# Patient Record
Sex: Female | Born: 1945 | Hispanic: No | Marital: Married | Smoking: Never smoker
Health system: Southern US, Community
[De-identification: ages and names within clinical notes are randomized; demographics above are authoritative.]

## PROBLEM LIST (undated history)

## (undated) DIAGNOSIS — E119 Type 2 diabetes mellitus without complications: Secondary | ICD-10-CM

## (undated) DIAGNOSIS — C189 Malignant neoplasm of colon, unspecified: Secondary | ICD-10-CM

## (undated) HISTORY — PX: COLON SURGERY: SHX602

---

## 2015-06-13 DIAGNOSIS — R202 Paresthesia of skin: Secondary | ICD-10-CM | POA: Diagnosis not present

## 2015-06-13 DIAGNOSIS — C189 Malignant neoplasm of colon, unspecified: Secondary | ICD-10-CM | POA: Diagnosis not present

## 2015-06-13 DIAGNOSIS — G622 Polyneuropathy due to other toxic agents: Secondary | ICD-10-CM | POA: Diagnosis not present

## 2015-06-13 DIAGNOSIS — R200 Anesthesia of skin: Secondary | ICD-10-CM | POA: Diagnosis not present

## 2015-06-13 DIAGNOSIS — E0800 Diabetes mellitus due to underlying condition with hyperosmolarity without nonketotic hyperglycemic-hyperosmolar coma (NKHHC): Secondary | ICD-10-CM | POA: Diagnosis not present

## 2015-06-17 DIAGNOSIS — Z1231 Encounter for screening mammogram for malignant neoplasm of breast: Secondary | ICD-10-CM | POA: Diagnosis not present

## 2015-07-03 DIAGNOSIS — Z85038 Personal history of other malignant neoplasm of large intestine: Secondary | ICD-10-CM | POA: Diagnosis not present

## 2015-07-03 DIAGNOSIS — E785 Hyperlipidemia, unspecified: Secondary | ICD-10-CM | POA: Diagnosis not present

## 2015-07-03 DIAGNOSIS — E119 Type 2 diabetes mellitus without complications: Secondary | ICD-10-CM | POA: Diagnosis not present

## 2015-07-16 DIAGNOSIS — Z86010 Personal history of colonic polyps: Secondary | ICD-10-CM | POA: Diagnosis not present

## 2015-07-16 DIAGNOSIS — Z85038 Personal history of other malignant neoplasm of large intestine: Secondary | ICD-10-CM | POA: Diagnosis not present

## 2015-07-18 DIAGNOSIS — R202 Paresthesia of skin: Secondary | ICD-10-CM | POA: Diagnosis not present

## 2015-07-18 DIAGNOSIS — C189 Malignant neoplasm of colon, unspecified: Secondary | ICD-10-CM | POA: Diagnosis not present

## 2015-07-18 DIAGNOSIS — E0800 Diabetes mellitus due to underlying condition with hyperosmolarity without nonketotic hyperglycemic-hyperosmolar coma (NKHHC): Secondary | ICD-10-CM | POA: Diagnosis not present

## 2015-12-17 DIAGNOSIS — E559 Vitamin D deficiency, unspecified: Secondary | ICD-10-CM | POA: Diagnosis not present

## 2015-12-17 DIAGNOSIS — R200 Anesthesia of skin: Secondary | ICD-10-CM | POA: Diagnosis not present

## 2015-12-17 DIAGNOSIS — C189 Malignant neoplasm of colon, unspecified: Secondary | ICD-10-CM | POA: Diagnosis not present

## 2015-12-17 DIAGNOSIS — E0800 Diabetes mellitus due to underlying condition with hyperosmolarity without nonketotic hyperglycemic-hyperosmolar coma (NKHHC): Secondary | ICD-10-CM | POA: Diagnosis not present

## 2015-12-17 DIAGNOSIS — G622 Polyneuropathy due to other toxic agents: Secondary | ICD-10-CM | POA: Diagnosis not present

## 2016-01-28 ENCOUNTER — Emergency Department (HOSPITAL_COMMUNITY): Payer: Commercial Managed Care - HMO

## 2016-01-28 ENCOUNTER — Encounter (HOSPITAL_COMMUNITY): Payer: Self-pay | Admitting: Emergency Medicine

## 2016-01-28 DIAGNOSIS — R61 Generalized hyperhidrosis: Secondary | ICD-10-CM | POA: Insufficient documentation

## 2016-01-28 DIAGNOSIS — Z85038 Personal history of other malignant neoplasm of large intestine: Secondary | ICD-10-CM | POA: Diagnosis not present

## 2016-01-28 DIAGNOSIS — R0789 Other chest pain: Secondary | ICD-10-CM | POA: Diagnosis not present

## 2016-01-28 DIAGNOSIS — E119 Type 2 diabetes mellitus without complications: Secondary | ICD-10-CM | POA: Insufficient documentation

## 2016-01-28 DIAGNOSIS — R0602 Shortness of breath: Secondary | ICD-10-CM | POA: Insufficient documentation

## 2016-01-28 DIAGNOSIS — R0781 Pleurodynia: Secondary | ICD-10-CM | POA: Diagnosis not present

## 2016-01-28 DIAGNOSIS — Z79899 Other long term (current) drug therapy: Secondary | ICD-10-CM | POA: Insufficient documentation

## 2016-01-28 DIAGNOSIS — R079 Chest pain, unspecified: Secondary | ICD-10-CM | POA: Diagnosis present

## 2016-01-28 LAB — CBC
HCT: 38 % (ref 36.0–46.0)
HEMOGLOBIN: 13 g/dL (ref 12.0–15.0)
MCH: 31 pg (ref 26.0–34.0)
MCHC: 34.2 g/dL (ref 30.0–36.0)
MCV: 90.5 fL (ref 78.0–100.0)
PLATELETS: 234 10*3/uL (ref 150–400)
RBC: 4.2 MIL/uL (ref 3.87–5.11)
RDW: 12 % (ref 11.5–15.5)
WBC: 6.3 10*3/uL (ref 4.0–10.5)

## 2016-01-28 LAB — BASIC METABOLIC PANEL
ANION GAP: 8 (ref 5–15)
BUN: 11 mg/dL (ref 6–20)
CALCIUM: 9 mg/dL (ref 8.9–10.3)
CO2: 26 mmol/L (ref 22–32)
CREATININE: 0.61 mg/dL (ref 0.44–1.00)
Chloride: 107 mmol/L (ref 101–111)
Glucose, Bld: 143 mg/dL — ABNORMAL HIGH (ref 65–99)
Potassium: 4.5 mmol/L (ref 3.5–5.1)
Sodium: 141 mmol/L (ref 135–145)

## 2016-01-28 LAB — I-STAT TROPONIN, ED: TROPONIN I, POC: 0 ng/mL (ref 0.00–0.08)

## 2016-01-28 NOTE — ED Notes (Signed)
Pt. reports intermittent central/right chest pain with SOB and diaphoresis onset last week , pain increases with deep inspiration , denies cough , nausea or fever.

## 2016-01-29 ENCOUNTER — Emergency Department (HOSPITAL_COMMUNITY)
Admission: EM | Admit: 2016-01-29 | Discharge: 2016-01-29 | Disposition: A | Discharge status: Home / Self Care | Payer: Commercial Managed Care - HMO | Attending: Emergency Medicine | Admitting: Emergency Medicine

## 2016-01-29 DIAGNOSIS — R0789 Other chest pain: Secondary | ICD-10-CM | POA: Diagnosis not present

## 2016-01-29 DIAGNOSIS — R0781 Pleurodynia: Secondary | ICD-10-CM

## 2016-01-29 HISTORY — DX: Malignant neoplasm of colon, unspecified: C18.9

## 2016-01-29 HISTORY — DX: Type 2 diabetes mellitus without complications: E11.9

## 2016-01-29 LAB — D-DIMER, QUANTITATIVE: D-Dimer, Quant: 0.49 ug/mL-FEU (ref 0.00–0.50)

## 2016-01-29 LAB — I-STAT TROPONIN, ED: Troponin i, poc: 0 ng/mL (ref 0.00–0.08)

## 2016-01-29 NOTE — ED Provider Notes (Signed)
CSN: [id number]     Arrival date & time 01/28/16  2021 History   By signing my name below, I, Forrestine Him, attest that this documentation has been prepared under the direction and in the presence of Merryl Hacker, MD.  Electronically Signed: Forrestine Him, ED Scribe. 01/29/2016. 3:56 AM.   Chief Complaint  Patient presents with  . Chest Pain   The history is provided by the patient. No language interpreter was used.    HPI Comments: Dana Bell is a 70 y.o. female with a PMHx of colon cancer and DM who presents to the Emergency Department complaining of intermittent, ongoing central sharp and R sided chest pain x 1 week. Currently she is chest pain free. When episodes come, chest pain is exacerbated with deep breathing without any alleviating factors. Pt also reports intermittent associated shortness of breath and diaphoresis. NO association with ambulation.  No OTC medications or home remedies attempted prior to arrival. No recent fever, chills, nausea, vomiting, diarrhea, or abdominal pain. No prior history of blood clots. She denies any recent long distance travel. No prior history of heart disease or HTN. She is not smoker. No known allergies to medications.  PCP: No primary care provider on file.    Past Medical History  Diagnosis Date  . Colon cancer ([place])   . Diabetes mellitus without complication [facility])    Past Surgical History  Procedure Laterality Date  . Colon surgery     No family history on file. Social History  Substance Use Topics  . Smoking status: Never Smoker   . Smokeless tobacco: None  . Alcohol Use: No   OB History    No data available     Review of Systems  Constitutional: Positive for diaphoresis. Negative for fever and chills.  Respiratory: Positive for shortness of breath.   Cardiovascular: Positive for chest pain. Negative for leg swelling.  Gastrointestinal: Negative for nausea, vomiting and abdominal pain.  Neurological: Negative for headaches.   Psychiatric/Behavioral: Negative for confusion.  All other systems reviewed and are negative.     Allergies  Review of patient's allergies indicates no known allergies.  Home Medications   Prior to Admission medications   Medication Sig Start Date End Date Taking? Authorizing Provider  cholecalciferol (VITAMIN D) 1000 units tablet Take 1,000 Units by mouth daily.   Yes Historical Provider, MD  sitaGLIPtin (JANUVIA) 50 MG tablet Take 50 mg by mouth daily.   Yes Historical Provider, MD   Triage Vitals: BP 124/58 mmHg  Pulse 63  Temp(Src) 97.6 F (36.4 C) (Oral)  Resp 20  SpO2 95%   Physical Exam  Constitutional: She is oriented to person, place, and time. She appears well-developed and well-nourished. No distress.  HENT:  Head: Normocephalic and atraumatic.  Cardiovascular: Normal rate and regular rhythm.   Pulmonary/Chest: Effort normal and breath sounds normal. No respiratory distress. She has no wheezes. She exhibits no tenderness.  Abdominal: Soft. Bowel sounds are normal. There is no tenderness. There is no rebound.  Musculoskeletal: She exhibits no edema.  Neurological: She is alert and oriented to person, place, and time.  Skin: Skin is warm and dry.  Psychiatric: She has a normal mood and affect.  Nursing note and vitals reviewed.   ED Course  Procedures (including critical care time)  DIAGNOSTIC STUDIES: Oxygen Saturation is 95% on RA, Normal by my interpretation.    COORDINATION OF CARE: 3:43 AM- Will order CXR, BMP, CBC, i-stat troponin, and EKG. Discussed treatment plan  with pt at bedside and pt agreed to plan.     Labs Review Labs Reviewed  BASIC METABOLIC PANEL - Abnormal; Notable for the following:    Glucose, Bld 143 (*)    All other components within normal limits  CBC  D-DIMER, QUANTITATIVE (NOT AT [facility])  I-STAT TROPOININ, ED  Randolm Idol, ED    Imaging Review Dg Chest 2 View  01/28/2016  CLINICAL DATA:  Chest pain and shortness of  breath for 2 days, worse tonight. EXAM: CHEST  2 VIEW COMPARISON:  None. FINDINGS: Normal heart size and pulmonary vascularity. No focal airspace disease or consolidation in the lungs. No blunting of costophrenic angles. No pneumothorax. Mediastinal contours appear intact. Degenerative changes in the spine and shoulders. IMPRESSION: No active cardiopulmonary disease. Electronically Signed   By: Lucienne Capers M.D.   On: 01/28/2016 21:17   I have personally reviewed and evaluated these images and lab results as part of my medical decision-making.   EKG Interpretation   Date/Time:  Tuesday January 28 2016 20:26:13 EDT Ventricular Rate:  75 PR Interval:  168 QRS Duration: 80 QT Interval:  394 QTC Calculation: 439 R Axis:   -92 Text Interpretation:  Normal sinus rhythm Right superior axis deviation  Low voltage QRS Inferior infarct , age undetermined Cannot rule out  Anterior infarct , age undetermined Abnormal ECG No prior for comparison  Confirmed by HORTON  MD, COURTNEY ([id number]) on 01/29/2016 3:46:38 AM      MDM   Final diagnoses:  Pleuritic chest pain   Patient presents with chest pain. Ongoing for the last week. Pleuritic in nature. Somewhat atypical for ACS. Patient does have risk factor of age and diabetes.  Heart score 3/4 (age, DM, +/- EKG).  EKG shows no evidence of acute ischemia. She does have some evidence of inferior and anterior abnormalities with no prior for comparison.  She has been in the waiting room for over 8 hours. Initial troponin is negative. She is currently chest pain-free. Given that the plane is pleuritic in nature, will obtain a d-dimer. This is negative. Repeat troponin 9 hours after initial troponin is negative. Do feel like patient needs a definitive cardiac evaluation; however, given reassuring testing here and somewhat atypical nature of pain, feel she can be tested as an outpatient. Patient and her husband were updated at the bedside.  After history, exam,  and medical workup I feel the patient has been appropriately medically screened and is safe for discharge home. Pertinent diagnoses were discussed with the patient. Patient was given return precautions.  I personally performed the services described in this documentation, which was scribed in my presence. The recorded information has been reviewed and is accurate.   Merryl Hacker, MD 01/29/16 [phone]

## 2016-01-29 NOTE — Discharge Instructions (Signed)
Nonspecific Chest Pain  °Chest pain can be caused by many different conditions. There is always a chance that your pain could be related to something serious, such as a heart attack or a blood clot in your lungs. Chest pain can also be caused by conditions that are not life-threatening. If you have chest pain, it is very important to follow up with your health care provider. °CAUSES  °Chest pain can be caused by: °· Heartburn. °· Pneumonia or bronchitis. °· Anxiety or stress. °· Inflammation around your heart (pericarditis) or lung (pleuritis or pleurisy). °· A blood clot in your lung. °· A collapsed lung (pneumothorax). It can develop suddenly on its own (spontaneous pneumothorax) or from trauma to the chest. °· Shingles infection (varicella-zoster virus). °· Heart attack. °· Damage to the bones, muscles, and cartilage that make up your chest wall. This can include: °¨ Bruised bones due to injury. °¨ Strained muscles or cartilage due to frequent or repeated coughing or overwork. °¨ Fracture to one or more ribs. °¨ Sore cartilage due to inflammation (costochondritis). °RISK FACTORS  °Risk factors for chest pain may include: °· Activities that increase your risk for trauma or injury to your chest. °· Respiratory infections or conditions that cause frequent coughing. °· Medical conditions or overeating that can cause heartburn. °· Heart disease or family history of heart disease. °· Conditions or health behaviors that increase your risk of developing a blood clot. °· Having had chicken pox (varicella zoster). °SIGNS AND SYMPTOMS °Chest pain can feel like: °· Burning or tingling on the surface of your chest or deep in your chest. °· Crushing, pressure, aching, or squeezing pain. °· Dull or sharp pain that is worse when you move, cough, or take a deep breath. °· Pain that is also felt in your back, neck, shoulder, or arm, or pain that spreads to any of these areas. °Your chest pain may come and go, or it may stay  constant. °DIAGNOSIS °Lab tests or other studies may be needed to find the cause of your pain. Your health care provider may have you take a test called an ambulatory ECG (electrocardiogram). An ECG records your heartbeat patterns at the time the test is performed. You may also have other tests, such as: °· Transthoracic echocardiogram (TTE). During echocardiography, sound waves are used to create a picture of all of the heart structures and to look at how blood flows through your heart. °· Transesophageal echocardiogram (TEE). This is a more advanced imaging test that obtains images from inside your body. It allows your health care provider to see your heart in finer detail. °· Cardiac monitoring. This allows your health care provider to monitor your heart rate and rhythm in real time. °· Holter monitor. This is a portable device that records your heartbeat and can help to diagnose abnormal heartbeats. It allows your health care provider to track your heart activity for several days, if needed. °· Stress tests. These can be done through exercise or by taking medicine that makes your heart beat more quickly. °· Blood tests. °· Imaging tests. °TREATMENT  °Your treatment depends on what is causing your chest pain. Treatment may include: °· Medicines. These may include: °¨ Acid blockers for heartburn. °¨ Anti-inflammatory medicine. °¨ Pain medicine for inflammatory conditions. °¨ Antibiotic medicine, if an infection is present. °¨ Medicines to dissolve blood clots. °¨ Medicines to treat coronary artery disease. °· Supportive care for conditions that do not require medicines. This may include: °¨ Resting. °¨ Applying heat   or cold packs to injured areas. °¨ Limiting activities until pain decreases. °HOME CARE INSTRUCTIONS °· If you were prescribed an antibiotic medicine, finish it all even if you start to feel better. °· Avoid any activities that bring on chest pain. °· Do not use any tobacco products, including  cigarettes, chewing tobacco, or electronic cigarettes. If you need help quitting, ask your health care provider. °· Do not drink alcohol. °· Take medicines only as directed by your health care provider. °· Keep all follow-up visits as directed by your health care provider. This is important. This includes any further testing if your chest pain does not go away. °· If heartburn is the cause for your chest pain, you may be told to keep your head raised (elevated) while sleeping. This reduces the chance that acid will go from your stomach into your esophagus. °· Make lifestyle changes as directed by your health care provider. These may include: °¨ Getting regular exercise. Ask your health care provider to suggest some activities that are safe for you. °¨ Eating a heart-healthy diet. A registered dietitian can help you to learn healthy eating options. °¨ Maintaining a healthy weight. °¨ Managing diabetes, if necessary. °¨ Reducing stress. °SEEK MEDICAL CARE IF: °· Your chest pain does not go away after treatment. °· You have a rash with blisters on your chest. °· You have a fever. °SEEK IMMEDIATE MEDICAL CARE IF:  °· Your chest pain is worse. °· You have an increasing cough, or you cough up blood. °· You have severe abdominal pain. °· You have severe weakness. °· You faint. °· You have chills. °· You have sudden, unexplained chest discomfort. °· You have sudden, unexplained discomfort in your arms, back, neck, or jaw. °· You have shortness of breath at any time. °· You suddenly start to sweat, or your skin gets clammy. °· You feel nauseous or you vomit. °· You suddenly feel light-headed or dizzy. °· Your heart begins to beat quickly, or it feels like it is skipping beats. °These symptoms may represent a serious problem that is an emergency. Do not wait to see if the symptoms will go away. Get medical help right away. Call your local emergency services (911 in the [place] Do not drive yourself to the hospital. °  °This  information is not intended to replace advice given to you by your health care provider. Make sure you discuss any questions you have with your health care provider. °  °Document Released: 08/05/2005 Document Revised: 11/16/2014 Document Reviewed: 06/01/2014 °Elsevier Interactive Patient Education ©2016 Elsevier Inc. ° °

## 2016-05-15 DIAGNOSIS — E089 Diabetes mellitus due to underlying condition without complications: Secondary | ICD-10-CM | POA: Diagnosis not present

## 2016-05-15 DIAGNOSIS — Z6823 Body mass index (BMI) 23.0-23.9, adult: Secondary | ICD-10-CM | POA: Diagnosis not present

## 2016-05-15 DIAGNOSIS — E559 Vitamin D deficiency, unspecified: Secondary | ICD-10-CM | POA: Diagnosis not present

## 2016-05-15 DIAGNOSIS — G622 Polyneuropathy due to other toxic agents: Secondary | ICD-10-CM | POA: Diagnosis not present

## 2016-09-03 ENCOUNTER — Encounter (HOSPITAL_COMMUNITY): Payer: Self-pay | Admitting: Emergency Medicine

## 2016-09-03 ENCOUNTER — Emergency Department (HOSPITAL_COMMUNITY)
Admission: EM | Admit: 2016-09-03 | Discharge: 2016-09-03 | Disposition: A | Discharge status: Home / Self Care | Payer: Commercial Managed Care - HMO | Attending: Emergency Medicine | Admitting: Emergency Medicine

## 2016-09-03 DIAGNOSIS — K1370 Unspecified lesions of oral mucosa: Secondary | ICD-10-CM

## 2016-09-03 DIAGNOSIS — X58XXXA Exposure to other specified factors, initial encounter: Secondary | ICD-10-CM | POA: Diagnosis not present

## 2016-09-03 DIAGNOSIS — Z7984 Long term (current) use of oral hypoglycemic drugs: Secondary | ICD-10-CM | POA: Diagnosis not present

## 2016-09-03 DIAGNOSIS — E119 Type 2 diabetes mellitus without complications: Secondary | ICD-10-CM | POA: Diagnosis not present

## 2016-09-03 DIAGNOSIS — Y999 Unspecified external cause status: Secondary | ICD-10-CM | POA: Insufficient documentation

## 2016-09-03 DIAGNOSIS — Y929 Unspecified place or not applicable: Secondary | ICD-10-CM | POA: Diagnosis not present

## 2016-09-03 DIAGNOSIS — Y939 Activity, unspecified: Secondary | ICD-10-CM | POA: Diagnosis not present

## 2016-09-03 DIAGNOSIS — Z85038 Personal history of other malignant neoplasm of large intestine: Secondary | ICD-10-CM | POA: Diagnosis not present

## 2016-09-03 DIAGNOSIS — K1379 Other lesions of oral mucosa: Secondary | ICD-10-CM

## 2016-09-03 DIAGNOSIS — S00522A Blister (nonthermal) of oral cavity, initial encounter: Secondary | ICD-10-CM | POA: Diagnosis not present

## 2016-09-03 NOTE — ED Notes (Signed)
EDP at bedside  

## 2016-09-03 NOTE — Discharge Instructions (Signed)
Do not pop the blood blister; this can increase the risk of infection. If the blood blister is painful, use over-the-counter painkillers, such as ibuprofen, to reduce the pain. In addition, apply a cold compress to the affected area to reduce the swelling. Blood blisters typically pop on their own and will naturally heal over the course of a week. Avoid acidic and salty foods because those can inflame the wound and prevent healing. If the blood blisters persist, please follow-up with your primary care provider or dentist.

## 2016-09-03 NOTE — ED Provider Notes (Signed)
[facility] DEPT Provider Note   CSN: [id number] Arrival date & time: 09/03/16  2156  By signing my name below, I, Dora Sims, attest that this documentation has been prepared under the direction and in the presence of Etta Quill, NP. Electronically Signed: Dora Sims, Scribe. 09/03/2016. 11:15 PM.  History   Chief Complaint Chief Complaint  Patient presents with  . blood blister    The history is provided by the patient. No language interpreter was used.     HPI Comments: Dana Bell is a 70 y.o. female who presents to the Emergency Department complaining of a blood blister to the inside of her mouth that began tonight. She states the blister has gotten bigger since onset. She also notes an additional, smaller blister further back in her mouth. Pt did not bite her buccal mucosa to her knowledge. She notes she has been treated for DM in the past but is not currently undergoing any treatment for diabetes. She denies associated pain, numbness, weakness, fever, chills, nausea, vomiting, or any other associated symptoms.   Past Medical History:  Diagnosis Date  . Colon cancer ([place])   . Diabetes mellitus without complication ([place])     There are no active problems to display for this patient.   Past Surgical History:  Procedure Laterality Date  . COLON SURGERY      OB History    No data available       Home Medications    Prior to Admission medications   Medication Sig Start Date End Date Taking? Authorizing Provider  cholecalciferol (VITAMIN D) 1000 units tablet Take 1,000 Units by mouth daily.    Historical Provider, MD  sitaGLIPtin (JANUVIA) 50 MG tablet Take 50 mg by mouth daily.    Historical Provider, MD    Family History No family history on file.  Social History Social History  Substance Use Topics  . Smoking status: Never Smoker  . Smokeless tobacco: Never Used  . Alcohol use No     Allergies   Review of patient's allergies indicates no  known allergies.   Review of Systems Review of Systems  Constitutional: Negative for chills and fever.  HENT: Positive for mouth sores (blisters to buccal mucosa).   Gastrointestinal: Negative for nausea and vomiting.  Neurological: Negative for weakness and numbness.  All other systems reviewed and are negative.   Physical Exam Updated Vital Signs BP 134/74 (BP Location: Left Arm)   Pulse 67   Temp 97.5 F (36.4 C) (Oral)   Resp 14   Ht 5\' 3"  (1.6 m)   Wt 136 lb 1 oz (61.7 kg)   SpO2 96%   BMI 24.10 kg/m   Physical Exam  Constitutional: She is oriented to person, place, and time. She appears well-developed and well-nourished. No distress.  HENT:  Head: Normocephalic and atraumatic.  Dark appearing lesion to the inside of the mouth, smaller lesion further back on the buccal mucosa.  Eyes: Conjunctivae and EOM are normal.  Neck: Neck supple. No tracheal deviation present.  Cardiovascular: Normal rate.   Pulmonary/Chest: Effort normal. No respiratory distress.  Musculoskeletal: Normal range of motion.  Neurological: She is alert and oriented to person, place, and time.  Skin: Skin is warm and dry.  Psychiatric: She has a normal mood and affect. Her behavior is normal.  Nursing note and vitals reviewed.   ED Treatments / Results  Labs (all labs ordered are listed, but only abnormal results are displayed) Labs Reviewed - No data to  display  EKG  EKG Interpretation None       Radiology No results found.  Procedures Procedures (including critical care time)  DIAGNOSTIC STUDIES: Oxygen Saturation is 100% on RA, normal by my interpretation.    COORDINATION OF CARE: 11:22 PM Discussed treatment plan with pt at bedside and pt agreed to plan.  Medications Ordered in ED Medications - No data to display   Initial Impression / Assessment and Plan / ED Course  I have reviewed the triage vital signs and the nursing notes.  Pertinent labs & imaging results that  were available during my care of the patient were reviewed by me and considered in my medical decision making (see chart for details).  Clinical Course  Sudden onset of painless blood filled lesion on the buccal mucosa. Patient not on blood thinners. Patient discussed with and seen by Dr. Roxanne Mins.   Symptomatic care instructions provided. Return precautions discussed. Patient to follow-up with PCP and/or dentist.   I personally performed the services described in this documentation, which was scribed in my presence. The recorded information has been reviewed and is accurate.  Final Clinical Impressions(s) / ED Diagnoses   Final diagnoses:  Oral mucosal lesion  Angina bullosa hemorrhagica    New Prescriptions New Prescriptions   No medications on file     Etta Quill, NP [id number]    Delora Fuel, MD [id number]

## 2016-09-03 NOTE — ED Triage Notes (Signed)
Pt. presents with small blood blister at right lower buccal mucosa onset this evening  , denies injury or pain , no bleeding .

## 2016-10-28 DIAGNOSIS — E119 Type 2 diabetes mellitus without complications: Secondary | ICD-10-CM | POA: Diagnosis not present

## 2016-10-28 DIAGNOSIS — H53002 Unspecified amblyopia, left eye: Secondary | ICD-10-CM | POA: Diagnosis not present

## 2016-10-28 DIAGNOSIS — H35373 Puckering of macula, bilateral: Secondary | ICD-10-CM | POA: Diagnosis not present

## 2016-11-04 IMAGING — DX DG CHEST 2V
2 series · 2 of 2 positions shown · non-contrast
Comparison: None.

CLINICAL DATA: Chest pain and shortness of breath for 2 days, worse
tonight.

EXAM:
CHEST  2 VIEW

[w chest pa]
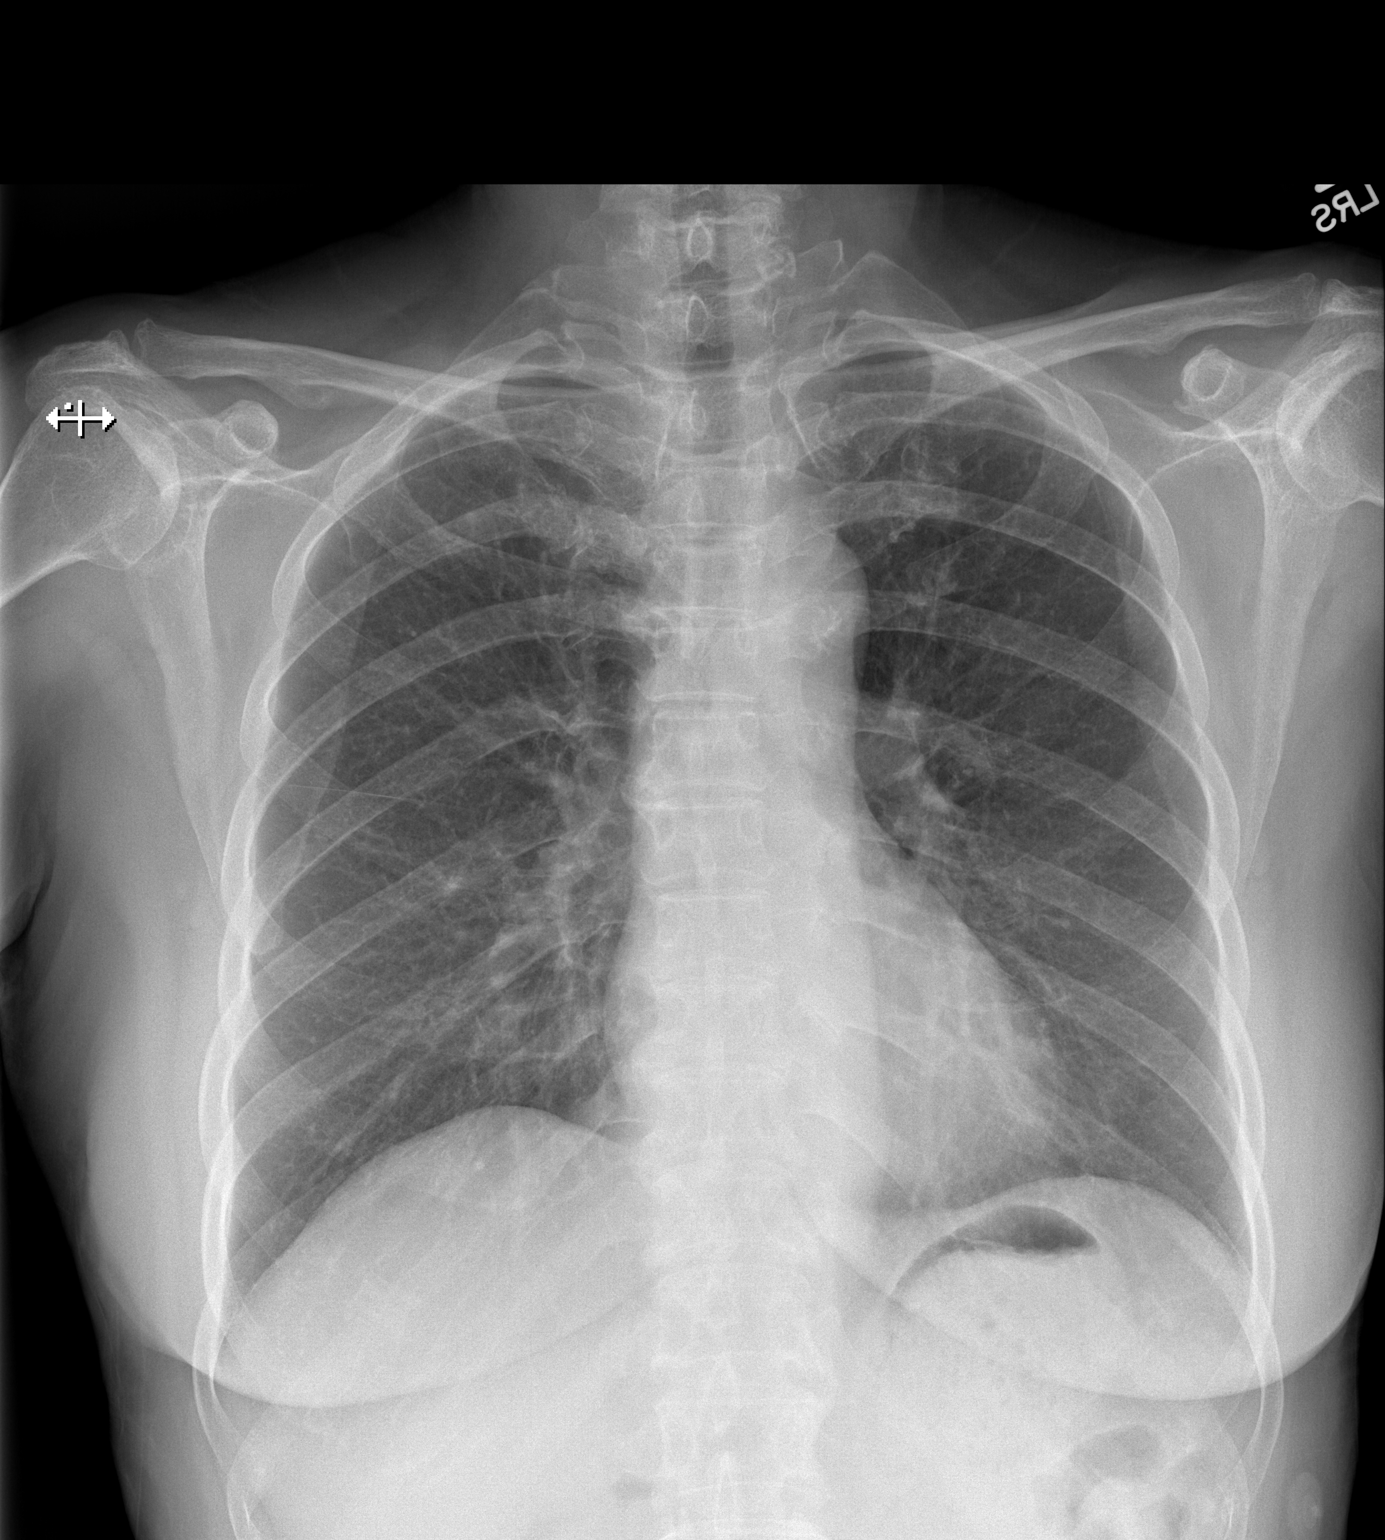

[w chest lat]
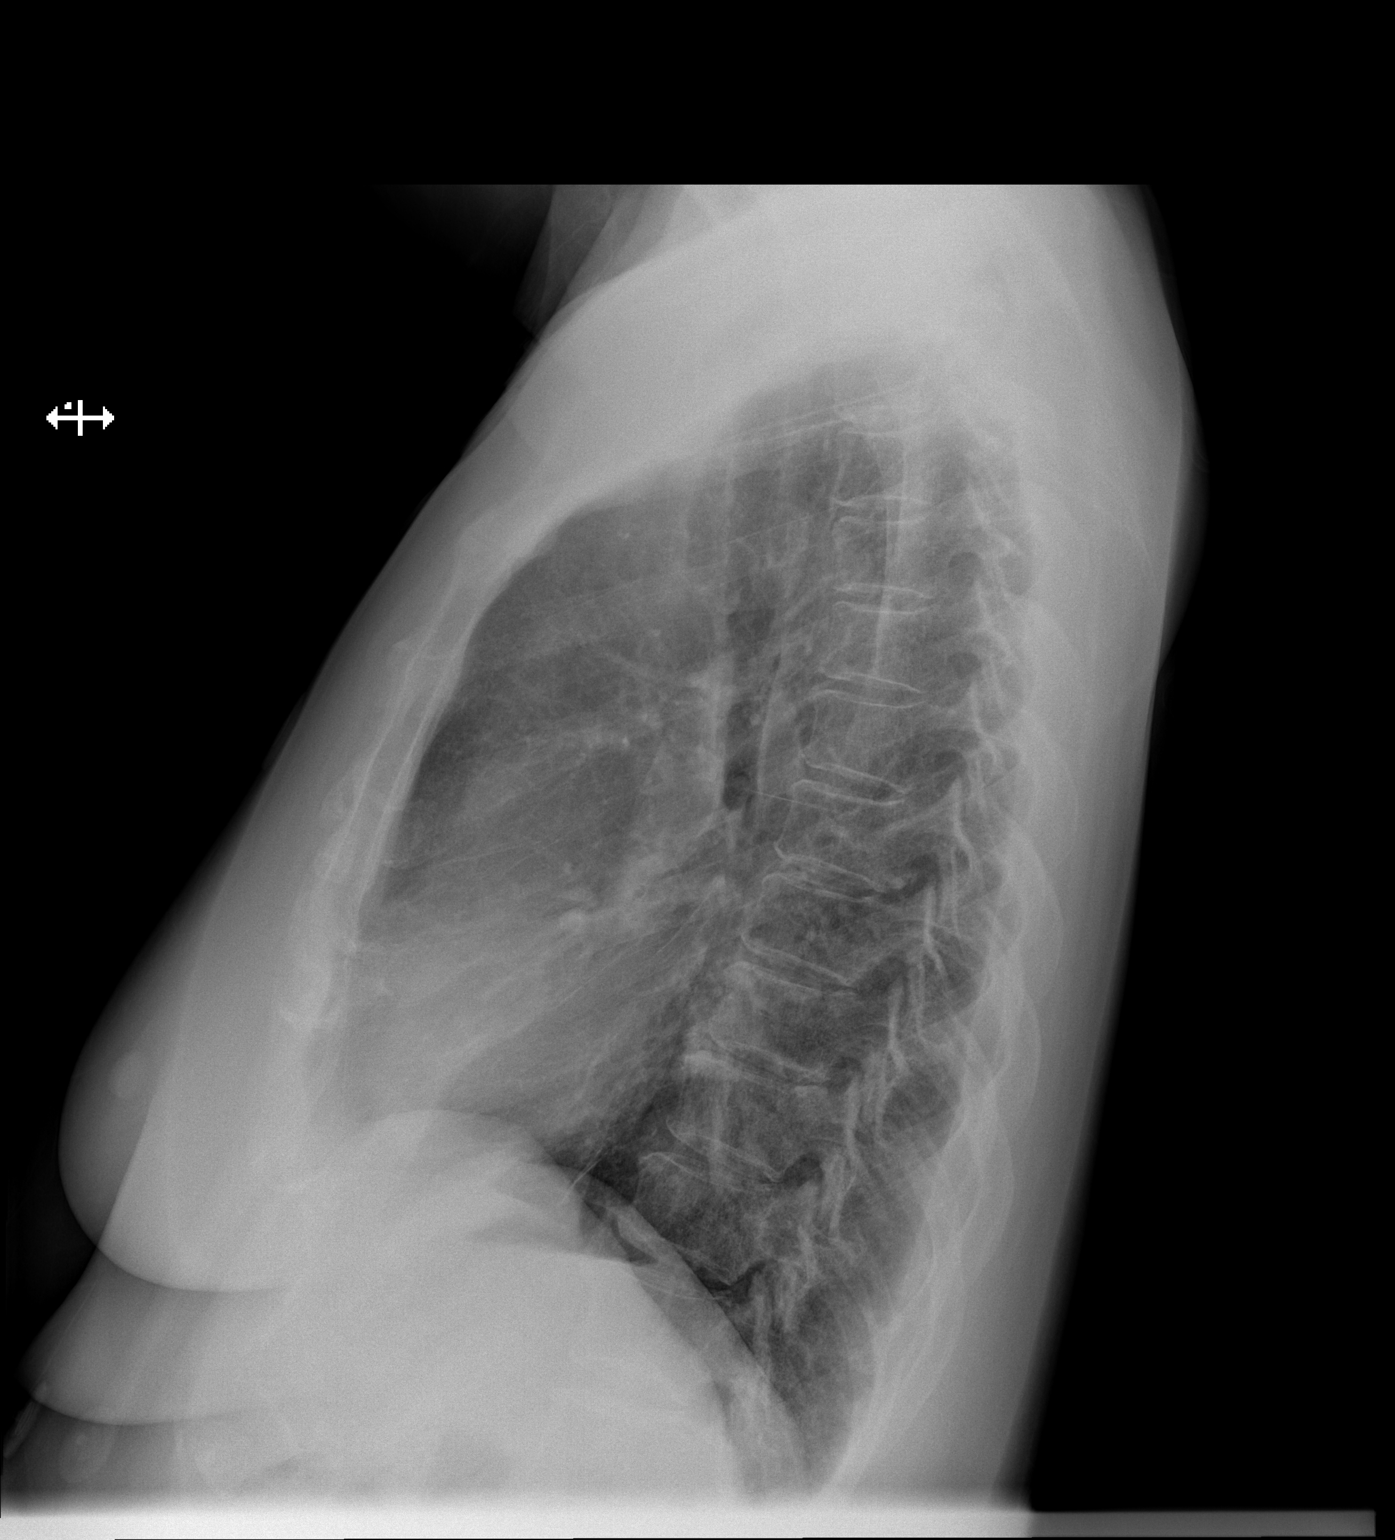

[2 of 2 positions shown; findings below may reference images not displayed]

FINDINGS: Normal heart size and pulmonary vascularity. No focal airspace
disease or consolidation in the lungs. No blunting of costophrenic
angles. No pneumothorax. Mediastinal contours appear intact.
Degenerative changes in the spine and shoulders.
IMPRESSION: No active cardiopulmonary disease.

## 2016-11-10 DIAGNOSIS — E089 Diabetes mellitus due to underlying condition without complications: Secondary | ICD-10-CM | POA: Diagnosis not present

## 2016-11-10 DIAGNOSIS — G622 Polyneuropathy due to other toxic agents: Secondary | ICD-10-CM | POA: Diagnosis not present

## 2016-11-10 DIAGNOSIS — Z6823 Body mass index (BMI) 23.0-23.9, adult: Secondary | ICD-10-CM | POA: Diagnosis not present

## 2016-11-10 DIAGNOSIS — C189 Malignant neoplasm of colon, unspecified: Secondary | ICD-10-CM | POA: Diagnosis not present

## 2016-11-10 DIAGNOSIS — E559 Vitamin D deficiency, unspecified: Secondary | ICD-10-CM | POA: Diagnosis not present

## 2016-11-10 DIAGNOSIS — Z79899 Other long term (current) drug therapy: Secondary | ICD-10-CM | POA: Diagnosis not present

## 2017-04-13 DIAGNOSIS — G622 Polyneuropathy due to other toxic agents: Secondary | ICD-10-CM | POA: Diagnosis not present

## 2017-04-13 DIAGNOSIS — E089 Diabetes mellitus due to underlying condition without complications: Secondary | ICD-10-CM | POA: Diagnosis not present

## 2017-04-13 DIAGNOSIS — E559 Vitamin D deficiency, unspecified: Secondary | ICD-10-CM | POA: Diagnosis not present

## 2017-04-13 DIAGNOSIS — M130 Polyarthritis, unspecified: Secondary | ICD-10-CM | POA: Diagnosis not present

## 2017-05-27 DIAGNOSIS — R69 Illness, unspecified: Secondary | ICD-10-CM | POA: Diagnosis not present

## 2017-09-10 DIAGNOSIS — E089 Diabetes mellitus due to underlying condition without complications: Secondary | ICD-10-CM | POA: Diagnosis not present

## 2017-09-10 DIAGNOSIS — M130 Polyarthritis, unspecified: Secondary | ICD-10-CM | POA: Diagnosis not present

## 2017-09-10 DIAGNOSIS — E0840 Diabetes mellitus due to underlying condition with diabetic neuropathy, unspecified: Secondary | ICD-10-CM | POA: Diagnosis not present

## 2017-09-10 DIAGNOSIS — E559 Vitamin D deficiency, unspecified: Secondary | ICD-10-CM | POA: Diagnosis not present

## 2017-09-10 DIAGNOSIS — C189 Malignant neoplasm of colon, unspecified: Secondary | ICD-10-CM | POA: Diagnosis not present

## 2017-09-10 DIAGNOSIS — E669 Obesity, unspecified: Secondary | ICD-10-CM | POA: Diagnosis not present

## 2017-11-17 DIAGNOSIS — H53002 Unspecified amblyopia, left eye: Secondary | ICD-10-CM | POA: Diagnosis not present

## 2017-11-17 DIAGNOSIS — E119 Type 2 diabetes mellitus without complications: Secondary | ICD-10-CM | POA: Diagnosis not present

## 2017-11-17 DIAGNOSIS — Z961 Presence of intraocular lens: Secondary | ICD-10-CM | POA: Diagnosis not present

## 2017-11-17 DIAGNOSIS — H35373 Puckering of macula, bilateral: Secondary | ICD-10-CM | POA: Diagnosis not present

## 2018-03-09 DIAGNOSIS — E089 Diabetes mellitus due to underlying condition without complications: Secondary | ICD-10-CM | POA: Diagnosis not present

## 2018-03-09 DIAGNOSIS — G622 Polyneuropathy due to other toxic agents: Secondary | ICD-10-CM | POA: Diagnosis not present

## 2018-03-09 DIAGNOSIS — M130 Polyarthritis, unspecified: Secondary | ICD-10-CM | POA: Diagnosis not present

## 2018-03-09 DIAGNOSIS — E559 Vitamin D deficiency, unspecified: Secondary | ICD-10-CM | POA: Diagnosis not present

## 2018-04-01 DIAGNOSIS — H53002 Unspecified amblyopia, left eye: Secondary | ICD-10-CM | POA: Diagnosis not present

## 2018-04-01 DIAGNOSIS — E119 Type 2 diabetes mellitus without complications: Secondary | ICD-10-CM | POA: Diagnosis not present

## 2018-04-01 DIAGNOSIS — H35373 Puckering of macula, bilateral: Secondary | ICD-10-CM | POA: Diagnosis not present

## 2018-04-01 DIAGNOSIS — H43813 Vitreous degeneration, bilateral: Secondary | ICD-10-CM | POA: Diagnosis not present

## 2018-04-01 DIAGNOSIS — H10413 Chronic giant papillary conjunctivitis, bilateral: Secondary | ICD-10-CM | POA: Diagnosis not present

## 2018-04-01 DIAGNOSIS — Z961 Presence of intraocular lens: Secondary | ICD-10-CM | POA: Diagnosis not present

## 2018-08-08 DIAGNOSIS — E118 Type 2 diabetes mellitus with unspecified complications: Secondary | ICD-10-CM | POA: Diagnosis not present

## 2018-08-08 DIAGNOSIS — E089 Diabetes mellitus due to underlying condition without complications: Secondary | ICD-10-CM | POA: Diagnosis not present

## 2018-08-08 DIAGNOSIS — I10 Essential (primary) hypertension: Secondary | ICD-10-CM | POA: Diagnosis not present

## 2018-08-08 DIAGNOSIS — E0840 Diabetes mellitus due to underlying condition with diabetic neuropathy, unspecified: Secondary | ICD-10-CM | POA: Diagnosis not present

## 2018-08-08 DIAGNOSIS — E559 Vitamin D deficiency, unspecified: Secondary | ICD-10-CM | POA: Diagnosis not present

## 2018-08-10 DIAGNOSIS — Z6822 Body mass index (BMI) 22.0-22.9, adult: Secondary | ICD-10-CM | POA: Diagnosis not present

## 2018-08-10 DIAGNOSIS — E089 Diabetes mellitus due to underlying condition without complications: Secondary | ICD-10-CM | POA: Diagnosis not present

## 2018-08-10 DIAGNOSIS — M130 Polyarthritis, unspecified: Secondary | ICD-10-CM | POA: Diagnosis not present

## 2018-08-10 DIAGNOSIS — D649 Anemia, unspecified: Secondary | ICD-10-CM | POA: Diagnosis not present

## 2018-08-10 DIAGNOSIS — C189 Malignant neoplasm of colon, unspecified: Secondary | ICD-10-CM | POA: Diagnosis not present

## 2018-09-15 DIAGNOSIS — Z1212 Encounter for screening for malignant neoplasm of rectum: Secondary | ICD-10-CM | POA: Diagnosis not present

## 2018-09-15 DIAGNOSIS — Z1211 Encounter for screening for malignant neoplasm of colon: Secondary | ICD-10-CM | POA: Diagnosis not present

## 2018-09-21 DIAGNOSIS — E7800 Pure hypercholesterolemia, unspecified: Secondary | ICD-10-CM | POA: Diagnosis not present

## 2018-09-21 DIAGNOSIS — Z0000 Encounter for general adult medical examination without abnormal findings: Secondary | ICD-10-CM | POA: Diagnosis not present

## 2018-09-21 DIAGNOSIS — Z6822 Body mass index (BMI) 22.0-22.9, adult: Secondary | ICD-10-CM | POA: Diagnosis not present

## 2018-09-21 DIAGNOSIS — I10 Essential (primary) hypertension: Secondary | ICD-10-CM | POA: Diagnosis not present

## 2018-09-21 DIAGNOSIS — E1110 Type 2 diabetes mellitus with ketoacidosis without coma: Secondary | ICD-10-CM | POA: Diagnosis not present

## 2018-09-27 DIAGNOSIS — Z1231 Encounter for screening mammogram for malignant neoplasm of breast: Secondary | ICD-10-CM | POA: Diagnosis not present

## 2018-09-27 DIAGNOSIS — Z780 Asymptomatic menopausal state: Secondary | ICD-10-CM | POA: Diagnosis not present

## 2018-09-27 DIAGNOSIS — M85852 Other specified disorders of bone density and structure, left thigh: Secondary | ICD-10-CM | POA: Diagnosis not present

## 2018-11-18 DIAGNOSIS — H43813 Vitreous degeneration, bilateral: Secondary | ICD-10-CM | POA: Diagnosis not present

## 2018-11-18 DIAGNOSIS — H53002 Unspecified amblyopia, left eye: Secondary | ICD-10-CM | POA: Diagnosis not present

## 2018-11-18 DIAGNOSIS — H35373 Puckering of macula, bilateral: Secondary | ICD-10-CM | POA: Diagnosis not present

## 2018-11-18 DIAGNOSIS — H04123 Dry eye syndrome of bilateral lacrimal glands: Secondary | ICD-10-CM | POA: Diagnosis not present

## 2018-11-18 DIAGNOSIS — E119 Type 2 diabetes mellitus without complications: Secondary | ICD-10-CM | POA: Diagnosis not present

## 2018-11-18 DIAGNOSIS — Z961 Presence of intraocular lens: Secondary | ICD-10-CM | POA: Diagnosis not present

## 2018-12-26 DIAGNOSIS — E559 Vitamin D deficiency, unspecified: Secondary | ICD-10-CM | POA: Diagnosis not present

## 2018-12-26 DIAGNOSIS — E0840 Diabetes mellitus due to underlying condition with diabetic neuropathy, unspecified: Secondary | ICD-10-CM | POA: Diagnosis not present

## 2018-12-26 DIAGNOSIS — C189 Malignant neoplasm of colon, unspecified: Secondary | ICD-10-CM | POA: Diagnosis not present

## 2018-12-26 DIAGNOSIS — I10 Essential (primary) hypertension: Secondary | ICD-10-CM | POA: Diagnosis not present

## 2019-02-21 DIAGNOSIS — H1045 Other chronic allergic conjunctivitis: Secondary | ICD-10-CM | POA: Diagnosis not present

## 2019-02-21 DIAGNOSIS — H04123 Dry eye syndrome of bilateral lacrimal glands: Secondary | ICD-10-CM | POA: Diagnosis not present

## 2019-04-26 DIAGNOSIS — E559 Vitamin D deficiency, unspecified: Secondary | ICD-10-CM | POA: Diagnosis not present

## 2019-04-26 DIAGNOSIS — E669 Obesity, unspecified: Secondary | ICD-10-CM | POA: Diagnosis not present

## 2019-04-26 DIAGNOSIS — E1169 Type 2 diabetes mellitus with other specified complication: Secondary | ICD-10-CM | POA: Diagnosis not present

## 2019-04-26 DIAGNOSIS — R200 Anesthesia of skin: Secondary | ICD-10-CM | POA: Diagnosis not present

## 2019-04-26 DIAGNOSIS — C189 Malignant neoplasm of colon, unspecified: Secondary | ICD-10-CM | POA: Diagnosis not present

## 2019-04-26 DIAGNOSIS — I10 Essential (primary) hypertension: Secondary | ICD-10-CM | POA: Diagnosis not present

## 2019-04-26 DIAGNOSIS — E0840 Diabetes mellitus due to underlying condition with diabetic neuropathy, unspecified: Secondary | ICD-10-CM | POA: Diagnosis not present

## 2019-04-26 DIAGNOSIS — M130 Polyarthritis, unspecified: Secondary | ICD-10-CM | POA: Diagnosis not present

## 2019-04-26 DIAGNOSIS — E0800 Diabetes mellitus due to underlying condition with hyperosmolarity without nonketotic hyperglycemic-hyperosmolar coma (NKHHC): Secondary | ICD-10-CM | POA: Diagnosis not present

## 2019-04-26 DIAGNOSIS — E118 Type 2 diabetes mellitus with unspecified complications: Secondary | ICD-10-CM | POA: Diagnosis not present

## 2019-08-28 DIAGNOSIS — E7800 Pure hypercholesterolemia, unspecified: Secondary | ICD-10-CM | POA: Diagnosis not present

## 2019-08-28 DIAGNOSIS — Z6822 Body mass index (BMI) 22.0-22.9, adult: Secondary | ICD-10-CM | POA: Diagnosis not present

## 2019-08-28 DIAGNOSIS — E1169 Type 2 diabetes mellitus with other specified complication: Secondary | ICD-10-CM | POA: Diagnosis not present

## 2019-08-28 DIAGNOSIS — E785 Hyperlipidemia, unspecified: Secondary | ICD-10-CM | POA: Diagnosis not present

## 2019-08-28 DIAGNOSIS — I10 Essential (primary) hypertension: Secondary | ICD-10-CM | POA: Diagnosis not present

## 2019-08-28 DIAGNOSIS — E559 Vitamin D deficiency, unspecified: Secondary | ICD-10-CM | POA: Diagnosis not present

## 2019-08-28 DIAGNOSIS — M130 Polyarthritis, unspecified: Secondary | ICD-10-CM | POA: Diagnosis not present

## 2019-10-09 DIAGNOSIS — R0789 Other chest pain: Secondary | ICD-10-CM | POA: Diagnosis not present

## 2019-11-20 DIAGNOSIS — H53002 Unspecified amblyopia, left eye: Secondary | ICD-10-CM | POA: Diagnosis not present

## 2019-11-20 DIAGNOSIS — H04123 Dry eye syndrome of bilateral lacrimal glands: Secondary | ICD-10-CM | POA: Diagnosis not present

## 2019-11-20 DIAGNOSIS — E119 Type 2 diabetes mellitus without complications: Secondary | ICD-10-CM | POA: Diagnosis not present

## 2019-11-20 DIAGNOSIS — H35373 Puckering of macula, bilateral: Secondary | ICD-10-CM | POA: Diagnosis not present

## 2019-11-20 DIAGNOSIS — H43813 Vitreous degeneration, bilateral: Secondary | ICD-10-CM | POA: Diagnosis not present

## 2019-11-20 DIAGNOSIS — H1045 Other chronic allergic conjunctivitis: Secondary | ICD-10-CM | POA: Diagnosis not present

## 2020-01-09 DIAGNOSIS — E1169 Type 2 diabetes mellitus with other specified complication: Secondary | ICD-10-CM | POA: Diagnosis not present

## 2020-01-09 DIAGNOSIS — G629 Polyneuropathy, unspecified: Secondary | ICD-10-CM | POA: Diagnosis not present

## 2020-01-09 DIAGNOSIS — E785 Hyperlipidemia, unspecified: Secondary | ICD-10-CM | POA: Diagnosis not present

## 2020-01-09 DIAGNOSIS — I10 Essential (primary) hypertension: Secondary | ICD-10-CM | POA: Diagnosis not present

## 2020-01-24 ENCOUNTER — Ambulatory Visit (INDEPENDENT_AMBULATORY_CARE_PROVIDER_SITE_OTHER): Payer: Medicare HMO | Admitting: Podiatrist

## 2020-01-24 ENCOUNTER — Other Ambulatory Visit: Payer: Self-pay

## 2020-01-24 ENCOUNTER — Encounter: Payer: Self-pay | Admitting: Podiatrist

## 2020-01-24 VITALS — BP 131/64 | HR 65 | Temp 97.3°F | Resp 14

## 2020-01-24 DIAGNOSIS — L600 Ingrowing nail: Secondary | ICD-10-CM

## 2020-01-24 DIAGNOSIS — E119 Type 2 diabetes mellitus without complications: Secondary | ICD-10-CM | POA: Diagnosis not present

## 2020-01-24 NOTE — Patient Instructions (Signed)
Diabetes Mellitus and Foot Care Foot care is an important part of your health, especially when you have diabetes. Diabetes may cause you to have problems because of poor blood flow (circulation) to your feet and legs, which can cause your skin to:  Become thinner and drier.  Break more easily.  Heal more slowly.  Peel and crack. You may also have nerve damage (neuropathy) in your legs and feet, causing decreased feeling in them. This means that you may not notice minor injuries to your feet that could lead to more serious problems. Noticing and addressing any potential problems early is the best way to prevent future foot problems. How to care for your feet Foot hygiene  Wash your feet daily with warm water and mild soap. Do not use hot water. Then, pat your feet and the areas between your toes until they are completely dry. Do not soak your feet as this can dry your skin.  Trim your toenails straight across. Do not dig under them or around the cuticle. File the edges of your nails with an emery board or nail file.  Apply a moisturizing lotion or petroleum jelly to the skin on your feet and to dry, brittle toenails. Use lotion that does not contain alcohol and is unscented. Do not apply lotion between your toes. Shoes and socks  Wear clean socks or stockings every day. Make sure they are not too tight. Do not wear knee-high stockings since they may decrease blood flow to your legs.  Wear shoes that fit properly and have enough cushioning. Always look in your shoes before you put them on to be sure there are no objects inside.  To break in new shoes, wear them for just a few hours a day. This prevents injuries on your feet. Wounds, scrapes, corns, and calluses  Check your feet daily for blisters, cuts, bruises, sores, and redness. If you cannot see the bottom of your feet, use a mirror or ask someone for help.  Do not cut corns or calluses or try to remove them with medicine.  If you  find a minor scrape, cut, or break in the skin on your feet, keep it and the skin around it clean and dry. You may clean these areas with mild soap and water. Do not clean the area with peroxide, alcohol, or iodine.  If you have a wound, scrape, corn, or callus on your foot, look at it several times a day to make sure it is healing and not infected. Check for: ? Redness, swelling, or pain. ? Fluid or blood. ? Warmth. ? Pus or a bad smell. General instructions  Do not cross your legs. This may decrease blood flow to your feet.  Do not use heating pads or hot water bottles on your feet. They may burn your skin. If you have lost feeling in your feet or legs, you may not know this is happening until it is too late.  Protect your feet from hot and cold by wearing shoes, such as at the beach or on hot pavement.  Schedule a complete foot exam at least once a year (annually) or more often if you have foot problems. If you have foot problems, report any cuts, sores, or bruises to your health care provider immediately. Contact a health care provider if:  You have a medical condition that increases your risk of infection and you have any cuts, sores, or bruises on your feet.  You have an injury that is not   healing.  You have redness on your legs or feet.  You feel burning or tingling in your legs or feet.  You have pain or cramps in your legs and feet.  Your legs or feet are numb.  Your feet always feel cold.  You have pain around a toenail. Get help right away if:  You have a wound, scrape, corn, or callus on your foot and: ? You have pain, swelling, or redness that gets worse. ? You have fluid or blood coming from the wound, scrape, corn, or callus. ? Your wound, scrape, corn, or callus feels warm to the touch. ? You have pus or a bad smell coming from the wound, scrape, corn, or callus. ? You have a fever. ? You have a red line going up your leg. Summary  Check your feet every day  for cuts, sores, red spots, swelling, and blisters.  Moisturize feet and legs daily.  Wear shoes that fit properly and have enough cushioning.  If you have foot problems, report any cuts, sores, or bruises to your health care provider immediately.  Schedule a complete foot exam at least once a year (annually) or more often if you have foot problems. This information is not intended to replace advice given to you by your health care provider. Make sure you discuss any questions you have with your health care provider. Document Revised: 07/19/2019 Document Reviewed: 11/27/2016 Elsevier Patient Education  2020 [employer] Inc.  

## 2020-01-24 NOTE — Progress Notes (Signed)
    Chief Complaint  Patient presents with  . Nail Problem    i am here to get my feet checked due to i am a diabetic      HPI: Patient is 74 y.o. female who presents today for a diabetic foot evaluation.  She relates no current problems with her feet.  States at times her feet throb if she is on them a lot but otherwise they are not bothersome to her.    Review of Systems  DATA OBTAINED: from patient  GENERAL: Feels well no fevers, no fatigue, no changes in appetite SKIN: No itching, no rashes, no open wounds EYES: No eye pain,no redness, no discharge EARS: No earache,no ringing of ears, NOSE: No congestion, no drainage, no bleeding  MOUTH/THROAT: No mouth pain, No sore throat, No difficulty chewing or swallowing  RESPIRATORY: No cough, no wheezing, no SOB CARDIAC: No chest pain,no heart palpitations, GI: No abdominal pain, No Nausea, no vomiting, no diarrhea, no heartburn or no reflux  GU: No dysuria, no increased frequency or urgency MUSCULOSKELETAL: No unrelieved bone/joint pain,  NEUROLOGIC: Awake, alert, appropriate to situation, No change in mental status. PSYCHIATRIC: No overt anxiety or sadness.No behavior issue.      Physical Exam  GENERAL APPEARANCE: Alert, conversant. Appropriately groomed. No acute distress.   VASCULAR: Pedal pulses palpable 2/4  DP and PT bilateral.  Capillary refill time is immediate to all digits,  Proximal to distal cooling it warm to warm.  Digital hair growth is present bilateral   NEUROLOGIC: sensation is intact epicritically and protectively to 5.07 monofilament at 5/5 sites bilateral.  Light touch is intact bilateral, vibratory sensation intact bilateral, achilles tendon reflex is intact bilateral.   MUSCULOSKELETAL: acceptable muscle strength, tone and stability bilateral.  Intrinsic muscluature intact bilateral.  Range of motion at ankle and first MPJ is normal bilateral.  Mild hammertoe formation is present 2nd toes bilateral.    DERMATOLOGIC: skin is warm, supple, and dry.  No open lesions noted.  No interdigital maceration noted bilateral.  Slight incurvation on the medial side of the right hallux is noted.  No sign of infection, mild tenderness with pressure noted.    Assessment   Diabetes with minimal ingrown hallux nail right medial side  Plan  Discussed general foot health for diabetics and what to watch for as far as neuropathy and vascular issues.  Discussed we will keep an eye on the toenail and if it becomes painful or she notices any signs of infection she will call immediately to be seen.  Otherwise, her foot exam is normal.

## 2020-02-13 DIAGNOSIS — E1169 Type 2 diabetes mellitus with other specified complication: Secondary | ICD-10-CM | POA: Diagnosis not present

## 2020-02-13 DIAGNOSIS — G629 Polyneuropathy, unspecified: Secondary | ICD-10-CM | POA: Diagnosis not present

## 2020-02-13 DIAGNOSIS — I10 Essential (primary) hypertension: Secondary | ICD-10-CM | POA: Diagnosis not present

## 2020-05-14 DIAGNOSIS — E1169 Type 2 diabetes mellitus with other specified complication: Secondary | ICD-10-CM | POA: Diagnosis not present

## 2020-05-14 DIAGNOSIS — G629 Polyneuropathy, unspecified: Secondary | ICD-10-CM | POA: Diagnosis not present

## 2020-05-14 DIAGNOSIS — I10 Essential (primary) hypertension: Secondary | ICD-10-CM | POA: Diagnosis not present

## 2020-05-14 DIAGNOSIS — M130 Polyarthritis, unspecified: Secondary | ICD-10-CM | POA: Diagnosis not present

## 2020-05-14 DIAGNOSIS — E785 Hyperlipidemia, unspecified: Secondary | ICD-10-CM | POA: Diagnosis not present

## 2020-05-14 DIAGNOSIS — Z6823 Body mass index (BMI) 23.0-23.9, adult: Secondary | ICD-10-CM | POA: Diagnosis not present

## 2020-08-08 DIAGNOSIS — E0840 Diabetes mellitus due to underlying condition with diabetic neuropathy, unspecified: Secondary | ICD-10-CM | POA: Diagnosis not present

## 2020-08-08 DIAGNOSIS — E7849 Other hyperlipidemia: Secondary | ICD-10-CM | POA: Diagnosis not present

## 2020-08-08 DIAGNOSIS — G629 Polyneuropathy, unspecified: Secondary | ICD-10-CM | POA: Diagnosis not present

## 2020-09-13 DIAGNOSIS — I10 Essential (primary) hypertension: Secondary | ICD-10-CM | POA: Diagnosis not present

## 2020-09-13 DIAGNOSIS — E1169 Type 2 diabetes mellitus with other specified complication: Secondary | ICD-10-CM | POA: Diagnosis not present

## 2020-09-13 DIAGNOSIS — E782 Mixed hyperlipidemia: Secondary | ICD-10-CM | POA: Diagnosis not present

## 2020-09-13 DIAGNOSIS — E7800 Pure hypercholesterolemia, unspecified: Secondary | ICD-10-CM | POA: Diagnosis not present

## 2020-10-08 DIAGNOSIS — E0840 Diabetes mellitus due to underlying condition with diabetic neuropathy, unspecified: Secondary | ICD-10-CM | POA: Diagnosis not present

## 2020-10-08 DIAGNOSIS — E7849 Other hyperlipidemia: Secondary | ICD-10-CM | POA: Diagnosis not present

## 2020-10-08 DIAGNOSIS — G629 Polyneuropathy, unspecified: Secondary | ICD-10-CM | POA: Diagnosis not present

## 2020-10-08 DIAGNOSIS — E7800 Pure hypercholesterolemia, unspecified: Secondary | ICD-10-CM | POA: Diagnosis not present

## 2020-11-20 DIAGNOSIS — E119 Type 2 diabetes mellitus without complications: Secondary | ICD-10-CM | POA: Diagnosis not present

## 2020-11-20 DIAGNOSIS — Z961 Presence of intraocular lens: Secondary | ICD-10-CM | POA: Diagnosis not present

## 2020-11-20 DIAGNOSIS — H1045 Other chronic allergic conjunctivitis: Secondary | ICD-10-CM | POA: Diagnosis not present

## 2020-11-20 DIAGNOSIS — H04123 Dry eye syndrome of bilateral lacrimal glands: Secondary | ICD-10-CM | POA: Diagnosis not present

## 2020-11-20 DIAGNOSIS — H43813 Vitreous degeneration, bilateral: Secondary | ICD-10-CM | POA: Diagnosis not present

## 2020-11-20 DIAGNOSIS — H35373 Puckering of macula, bilateral: Secondary | ICD-10-CM | POA: Diagnosis not present

## 2021-01-10 DIAGNOSIS — M130 Polyarthritis, unspecified: Secondary | ICD-10-CM | POA: Diagnosis not present

## 2021-01-10 DIAGNOSIS — E1169 Type 2 diabetes mellitus with other specified complication: Secondary | ICD-10-CM | POA: Diagnosis not present

## 2021-01-10 DIAGNOSIS — I10 Essential (primary) hypertension: Secondary | ICD-10-CM | POA: Diagnosis not present

## 2021-01-10 DIAGNOSIS — E782 Mixed hyperlipidemia: Secondary | ICD-10-CM | POA: Diagnosis not present

## 2021-01-10 DIAGNOSIS — R202 Paresthesia of skin: Secondary | ICD-10-CM | POA: Diagnosis not present

## 2021-01-10 DIAGNOSIS — G622 Polyneuropathy due to other toxic agents: Secondary | ICD-10-CM | POA: Diagnosis not present

## 2021-04-08 DIAGNOSIS — E7849 Other hyperlipidemia: Secondary | ICD-10-CM | POA: Diagnosis not present

## 2021-04-08 DIAGNOSIS — G629 Polyneuropathy, unspecified: Secondary | ICD-10-CM | POA: Diagnosis not present

## 2021-04-08 DIAGNOSIS — E0840 Diabetes mellitus due to underlying condition with diabetic neuropathy, unspecified: Secondary | ICD-10-CM | POA: Diagnosis not present

## 2021-04-08 DIAGNOSIS — E7800 Pure hypercholesterolemia, unspecified: Secondary | ICD-10-CM | POA: Diagnosis not present

## 2021-05-08 DIAGNOSIS — E0840 Diabetes mellitus due to underlying condition with diabetic neuropathy, unspecified: Secondary | ICD-10-CM | POA: Diagnosis not present

## 2021-05-08 DIAGNOSIS — E7800 Pure hypercholesterolemia, unspecified: Secondary | ICD-10-CM | POA: Diagnosis not present

## 2021-05-08 DIAGNOSIS — E7849 Other hyperlipidemia: Secondary | ICD-10-CM | POA: Diagnosis not present

## 2021-05-08 DIAGNOSIS — G629 Polyneuropathy, unspecified: Secondary | ICD-10-CM | POA: Diagnosis not present

## 2021-05-16 DIAGNOSIS — I10 Essential (primary) hypertension: Secondary | ICD-10-CM | POA: Diagnosis not present

## 2021-05-16 DIAGNOSIS — E1169 Type 2 diabetes mellitus with other specified complication: Secondary | ICD-10-CM | POA: Diagnosis not present

## 2021-05-16 DIAGNOSIS — R202 Paresthesia of skin: Secondary | ICD-10-CM | POA: Diagnosis not present

## 2021-05-16 DIAGNOSIS — G622 Polyneuropathy due to other toxic agents: Secondary | ICD-10-CM | POA: Diagnosis not present

## 2021-05-16 DIAGNOSIS — E559 Vitamin D deficiency, unspecified: Secondary | ICD-10-CM | POA: Diagnosis not present

## 2021-07-09 DIAGNOSIS — E7849 Other hyperlipidemia: Secondary | ICD-10-CM | POA: Diagnosis not present

## 2021-07-09 DIAGNOSIS — G629 Polyneuropathy, unspecified: Secondary | ICD-10-CM | POA: Diagnosis not present

## 2021-07-09 DIAGNOSIS — E0840 Diabetes mellitus due to underlying condition with diabetic neuropathy, unspecified: Secondary | ICD-10-CM | POA: Diagnosis not present

## 2021-07-09 DIAGNOSIS — E7800 Pure hypercholesterolemia, unspecified: Secondary | ICD-10-CM | POA: Diagnosis not present

## 2021-09-08 DIAGNOSIS — E7849 Other hyperlipidemia: Secondary | ICD-10-CM | POA: Diagnosis not present

## 2021-09-08 DIAGNOSIS — E7800 Pure hypercholesterolemia, unspecified: Secondary | ICD-10-CM | POA: Diagnosis not present

## 2021-09-08 DIAGNOSIS — E0840 Diabetes mellitus due to underlying condition with diabetic neuropathy, unspecified: Secondary | ICD-10-CM | POA: Diagnosis not present

## 2021-09-16 DIAGNOSIS — E1169 Type 2 diabetes mellitus with other specified complication: Secondary | ICD-10-CM | POA: Diagnosis not present

## 2021-09-16 DIAGNOSIS — C189 Malignant neoplasm of colon, unspecified: Secondary | ICD-10-CM | POA: Diagnosis not present

## 2021-09-16 DIAGNOSIS — G629 Polyneuropathy, unspecified: Secondary | ICD-10-CM | POA: Diagnosis not present

## 2021-09-16 DIAGNOSIS — E782 Mixed hyperlipidemia: Secondary | ICD-10-CM | POA: Diagnosis not present

## 2021-09-16 DIAGNOSIS — C184 Malignant neoplasm of transverse colon: Secondary | ICD-10-CM | POA: Diagnosis not present

## 2021-09-16 DIAGNOSIS — Z0001 Encounter for general adult medical examination with abnormal findings: Secondary | ICD-10-CM | POA: Diagnosis not present

## 2021-09-16 DIAGNOSIS — R634 Abnormal weight loss: Secondary | ICD-10-CM | POA: Diagnosis not present

## 2021-11-20 DIAGNOSIS — H43813 Vitreous degeneration, bilateral: Secondary | ICD-10-CM | POA: Diagnosis not present

## 2021-11-20 DIAGNOSIS — Z961 Presence of intraocular lens: Secondary | ICD-10-CM | POA: Diagnosis not present

## 2021-11-20 DIAGNOSIS — E119 Type 2 diabetes mellitus without complications: Secondary | ICD-10-CM | POA: Diagnosis not present

## 2021-11-20 DIAGNOSIS — H35373 Puckering of macula, bilateral: Secondary | ICD-10-CM | POA: Diagnosis not present

## 2022-01-13 DIAGNOSIS — E7800 Pure hypercholesterolemia, unspecified: Secondary | ICD-10-CM | POA: Diagnosis not present

## 2022-01-13 DIAGNOSIS — Z6821 Body mass index (BMI) 21.0-21.9, adult: Secondary | ICD-10-CM | POA: Diagnosis not present

## 2022-01-13 DIAGNOSIS — I10 Essential (primary) hypertension: Secondary | ICD-10-CM | POA: Diagnosis not present

## 2022-01-13 DIAGNOSIS — E1169 Type 2 diabetes mellitus with other specified complication: Secondary | ICD-10-CM | POA: Diagnosis not present

## 2022-05-08 DIAGNOSIS — E0840 Diabetes mellitus due to underlying condition with diabetic neuropathy, unspecified: Secondary | ICD-10-CM | POA: Diagnosis not present

## 2022-05-08 DIAGNOSIS — E7849 Other hyperlipidemia: Secondary | ICD-10-CM | POA: Diagnosis not present

## 2022-05-15 DIAGNOSIS — Z6821 Body mass index (BMI) 21.0-21.9, adult: Secondary | ICD-10-CM | POA: Diagnosis not present

## 2022-05-15 DIAGNOSIS — E1169 Type 2 diabetes mellitus with other specified complication: Secondary | ICD-10-CM | POA: Diagnosis not present

## 2022-05-15 DIAGNOSIS — I10 Essential (primary) hypertension: Secondary | ICD-10-CM | POA: Diagnosis not present

## 2022-05-15 DIAGNOSIS — C189 Malignant neoplasm of colon, unspecified: Secondary | ICD-10-CM | POA: Diagnosis not present

## 2022-05-15 DIAGNOSIS — C181 Malignant neoplasm of appendix: Secondary | ICD-10-CM | POA: Diagnosis not present

## 2022-05-15 DIAGNOSIS — E782 Mixed hyperlipidemia: Secondary | ICD-10-CM | POA: Diagnosis not present

## 2022-08-08 DIAGNOSIS — E7849 Other hyperlipidemia: Secondary | ICD-10-CM | POA: Diagnosis not present

## 2022-08-08 DIAGNOSIS — E7800 Pure hypercholesterolemia, unspecified: Secondary | ICD-10-CM | POA: Diagnosis not present

## 2022-10-16 DIAGNOSIS — E782 Mixed hyperlipidemia: Secondary | ICD-10-CM | POA: Diagnosis not present

## 2022-10-16 DIAGNOSIS — E1169 Type 2 diabetes mellitus with other specified complication: Secondary | ICD-10-CM | POA: Diagnosis not present

## 2022-10-16 DIAGNOSIS — E559 Vitamin D deficiency, unspecified: Secondary | ICD-10-CM | POA: Diagnosis not present

## 2022-10-16 DIAGNOSIS — I10 Essential (primary) hypertension: Secondary | ICD-10-CM | POA: Diagnosis not present

## 2022-11-25 DIAGNOSIS — Z0100 Encounter for examination of eyes and vision without abnormal findings: Secondary | ICD-10-CM | POA: Diagnosis not present

## 2022-11-25 DIAGNOSIS — Z961 Presence of intraocular lens: Secondary | ICD-10-CM | POA: Diagnosis not present

## 2022-11-25 DIAGNOSIS — H53002 Unspecified amblyopia, left eye: Secondary | ICD-10-CM | POA: Diagnosis not present

## 2022-11-25 DIAGNOSIS — H5213 Myopia, bilateral: Secondary | ICD-10-CM | POA: Diagnosis not present

## 2022-11-25 DIAGNOSIS — H35373 Puckering of macula, bilateral: Secondary | ICD-10-CM | POA: Diagnosis not present

## 2022-11-25 DIAGNOSIS — E119 Type 2 diabetes mellitus without complications: Secondary | ICD-10-CM | POA: Diagnosis not present

## 2023-02-19 DIAGNOSIS — E1169 Type 2 diabetes mellitus with other specified complication: Secondary | ICD-10-CM | POA: Diagnosis not present

## 2023-02-19 DIAGNOSIS — Z6821 Body mass index (BMI) 21.0-21.9, adult: Secondary | ICD-10-CM | POA: Diagnosis not present

## 2023-02-19 DIAGNOSIS — E782 Mixed hyperlipidemia: Secondary | ICD-10-CM | POA: Diagnosis not present

## 2023-02-19 DIAGNOSIS — I10 Essential (primary) hypertension: Secondary | ICD-10-CM | POA: Diagnosis not present

## 2023-07-22 DIAGNOSIS — M8580 Other specified disorders of bone density and structure, unspecified site: Secondary | ICD-10-CM | POA: Diagnosis not present

## 2023-07-22 DIAGNOSIS — C189 Malignant neoplasm of colon, unspecified: Secondary | ICD-10-CM | POA: Diagnosis not present

## 2023-07-22 DIAGNOSIS — E559 Vitamin D deficiency, unspecified: Secondary | ICD-10-CM | POA: Diagnosis not present

## 2023-07-22 DIAGNOSIS — I10 Essential (primary) hypertension: Secondary | ICD-10-CM | POA: Diagnosis not present

## 2023-07-22 DIAGNOSIS — M130 Polyarthritis, unspecified: Secondary | ICD-10-CM | POA: Diagnosis not present

## 2023-07-22 DIAGNOSIS — E1169 Type 2 diabetes mellitus with other specified complication: Secondary | ICD-10-CM | POA: Diagnosis not present

## 2023-07-22 DIAGNOSIS — E782 Mixed hyperlipidemia: Secondary | ICD-10-CM | POA: Diagnosis not present

## 2023-11-26 DIAGNOSIS — Z6821 Body mass index (BMI) 21.0-21.9, adult: Secondary | ICD-10-CM | POA: Diagnosis not present

## 2023-11-26 DIAGNOSIS — E1169 Type 2 diabetes mellitus with other specified complication: Secondary | ICD-10-CM | POA: Diagnosis not present

## 2023-11-26 DIAGNOSIS — M130 Polyarthritis, unspecified: Secondary | ICD-10-CM | POA: Diagnosis not present

## 2023-11-26 DIAGNOSIS — I10 Essential (primary) hypertension: Secondary | ICD-10-CM | POA: Diagnosis not present

## 2023-11-26 DIAGNOSIS — E7800 Pure hypercholesterolemia, unspecified: Secondary | ICD-10-CM | POA: Diagnosis not present

## 2023-11-26 DIAGNOSIS — E782 Mixed hyperlipidemia: Secondary | ICD-10-CM | POA: Diagnosis not present

## 2023-11-26 DIAGNOSIS — G622 Polyneuropathy due to other toxic agents: Secondary | ICD-10-CM | POA: Diagnosis not present

## 2023-12-01 DIAGNOSIS — Z961 Presence of intraocular lens: Secondary | ICD-10-CM | POA: Diagnosis not present

## 2023-12-01 DIAGNOSIS — H1045 Other chronic allergic conjunctivitis: Secondary | ICD-10-CM | POA: Diagnosis not present

## 2023-12-01 DIAGNOSIS — E119 Type 2 diabetes mellitus without complications: Secondary | ICD-10-CM | POA: Diagnosis not present

## 2023-12-01 DIAGNOSIS — H35373 Puckering of macula, bilateral: Secondary | ICD-10-CM | POA: Diagnosis not present

## 2023-12-01 DIAGNOSIS — H04123 Dry eye syndrome of bilateral lacrimal glands: Secondary | ICD-10-CM | POA: Diagnosis not present

## 2023-12-01 DIAGNOSIS — H43813 Vitreous degeneration, bilateral: Secondary | ICD-10-CM | POA: Diagnosis not present

## 2024-03-24 DIAGNOSIS — E7800 Pure hypercholesterolemia, unspecified: Secondary | ICD-10-CM | POA: Diagnosis not present

## 2024-03-24 DIAGNOSIS — E0840 Diabetes mellitus due to underlying condition with diabetic neuropathy, unspecified: Secondary | ICD-10-CM | POA: Diagnosis not present

## 2024-03-24 DIAGNOSIS — I10 Essential (primary) hypertension: Secondary | ICD-10-CM | POA: Diagnosis not present

## 2024-07-13 DIAGNOSIS — H04123 Dry eye syndrome of bilateral lacrimal glands: Secondary | ICD-10-CM | POA: Diagnosis not present

## 2024-07-13 DIAGNOSIS — Z961 Presence of intraocular lens: Secondary | ICD-10-CM | POA: Diagnosis not present

## 2024-07-13 DIAGNOSIS — E119 Type 2 diabetes mellitus without complications: Secondary | ICD-10-CM | POA: Diagnosis not present

## 2024-07-13 DIAGNOSIS — H43813 Vitreous degeneration, bilateral: Secondary | ICD-10-CM | POA: Diagnosis not present

## 2024-07-21 DIAGNOSIS — E0840 Diabetes mellitus due to underlying condition with diabetic neuropathy, unspecified: Secondary | ICD-10-CM | POA: Diagnosis not present

## 2024-07-21 DIAGNOSIS — E782 Mixed hyperlipidemia: Secondary | ICD-10-CM | POA: Diagnosis not present

## 2024-07-21 DIAGNOSIS — E1140 Type 2 diabetes mellitus with diabetic neuropathy, unspecified: Secondary | ICD-10-CM | POA: Diagnosis not present

## 2024-07-21 DIAGNOSIS — E559 Vitamin D deficiency, unspecified: Secondary | ICD-10-CM | POA: Diagnosis not present

## 2024-07-21 DIAGNOSIS — D649 Anemia, unspecified: Secondary | ICD-10-CM | POA: Diagnosis not present

## 2024-07-21 DIAGNOSIS — Z2821 Immunization not carried out because of patient refusal: Secondary | ICD-10-CM | POA: Diagnosis not present

## 2024-07-21 DIAGNOSIS — I10 Essential (primary) hypertension: Secondary | ICD-10-CM | POA: Diagnosis not present
